# Patient Record
Sex: Male | Born: 1960 | Race: White | Hispanic: No | Marital: Married | Smoking: Never smoker
Health system: Southern US, Community
[De-identification: ages and names within clinical notes are randomized; demographics above are authoritative.]

## PROBLEM LIST (undated history)

## (undated) DIAGNOSIS — I10 Essential (primary) hypertension: Secondary | ICD-10-CM

## (undated) HISTORY — PX: HERNIA REPAIR: SHX51

## (undated) HISTORY — PX: OTHER SURGICAL HISTORY: SHX169

---

## 2007-03-07 ENCOUNTER — Ambulatory Visit: Payer: Self-pay | Admitting: Internal Medicine

## 2008-02-08 ENCOUNTER — Ambulatory Visit: Payer: Self-pay | Admitting: Family Medicine

## 2010-11-10 ENCOUNTER — Ambulatory Visit: Payer: Self-pay | Admitting: Family Medicine

## 2010-12-21 ENCOUNTER — Ambulatory Visit: Payer: Self-pay | Admitting: Internal Medicine

## 2011-08-26 ENCOUNTER — Ambulatory Visit: Payer: Self-pay | Admitting: Emergency Medicine

## 2012-05-03 ENCOUNTER — Ambulatory Visit: Payer: Self-pay

## 2012-05-11 ENCOUNTER — Ambulatory Visit: Payer: Self-pay | Admitting: Family Medicine

## 2012-11-14 ENCOUNTER — Ambulatory Visit: Payer: Self-pay | Admitting: Family Medicine

## 2013-06-04 IMAGING — CR DG CHEST 2V
1 series · 3 of 3 positions shown · non-contrast
Comparison: none

REASON FOR EXAM: cough for over a week
COMMENTS:

[Series 1: pa · 0.17mm/px · 3 of 3 slices shown]
[im 1/3]
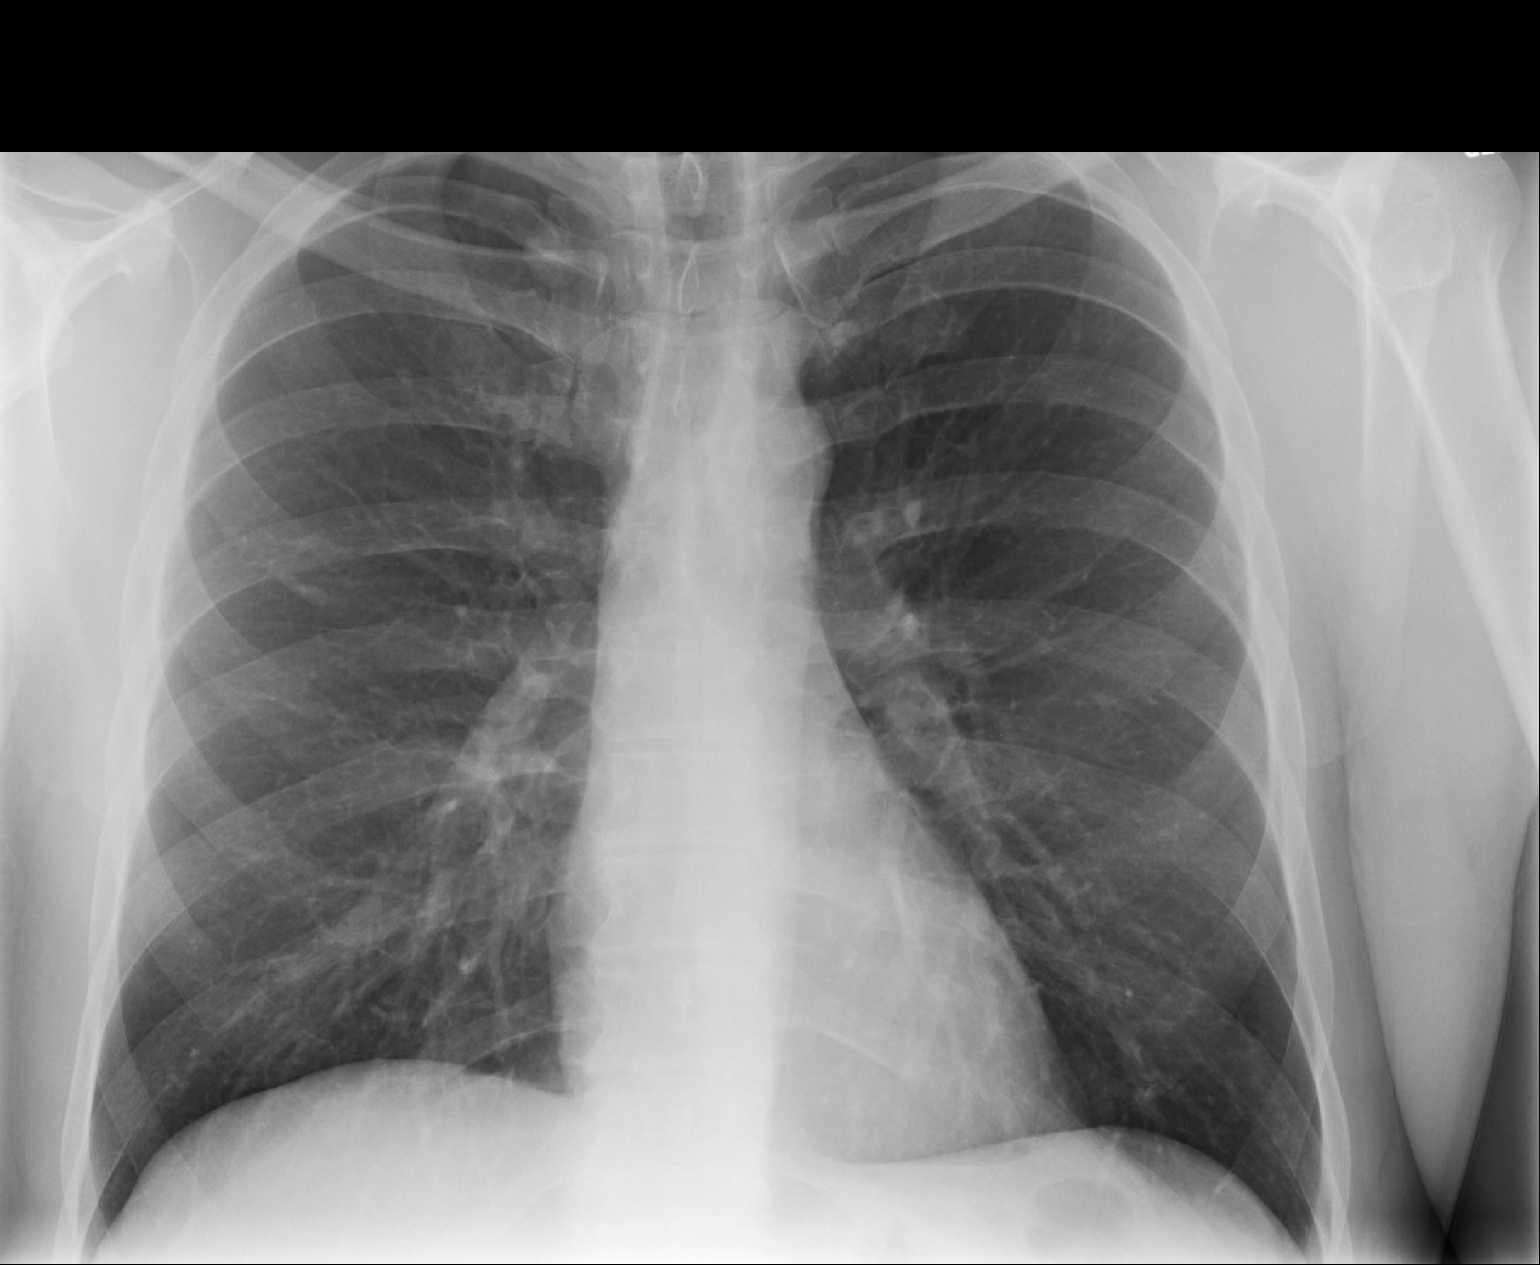
[im 2/3]
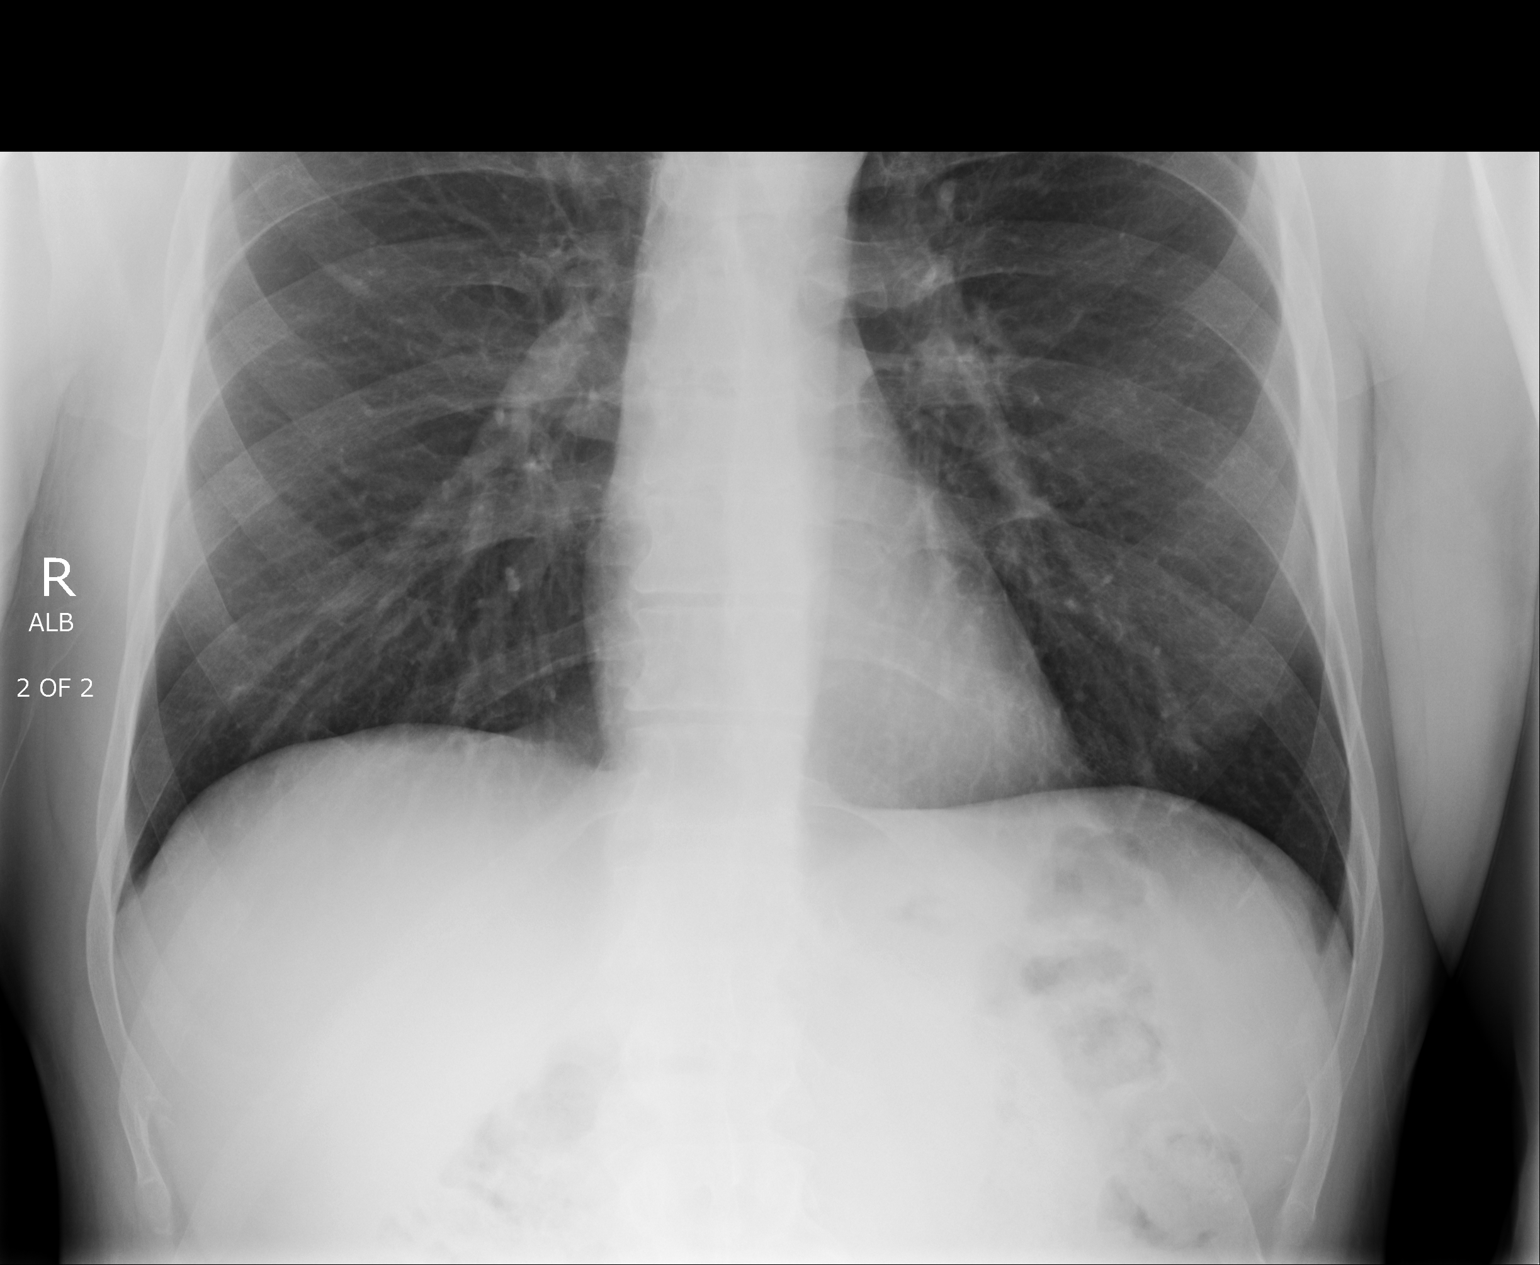
[im 3/3]
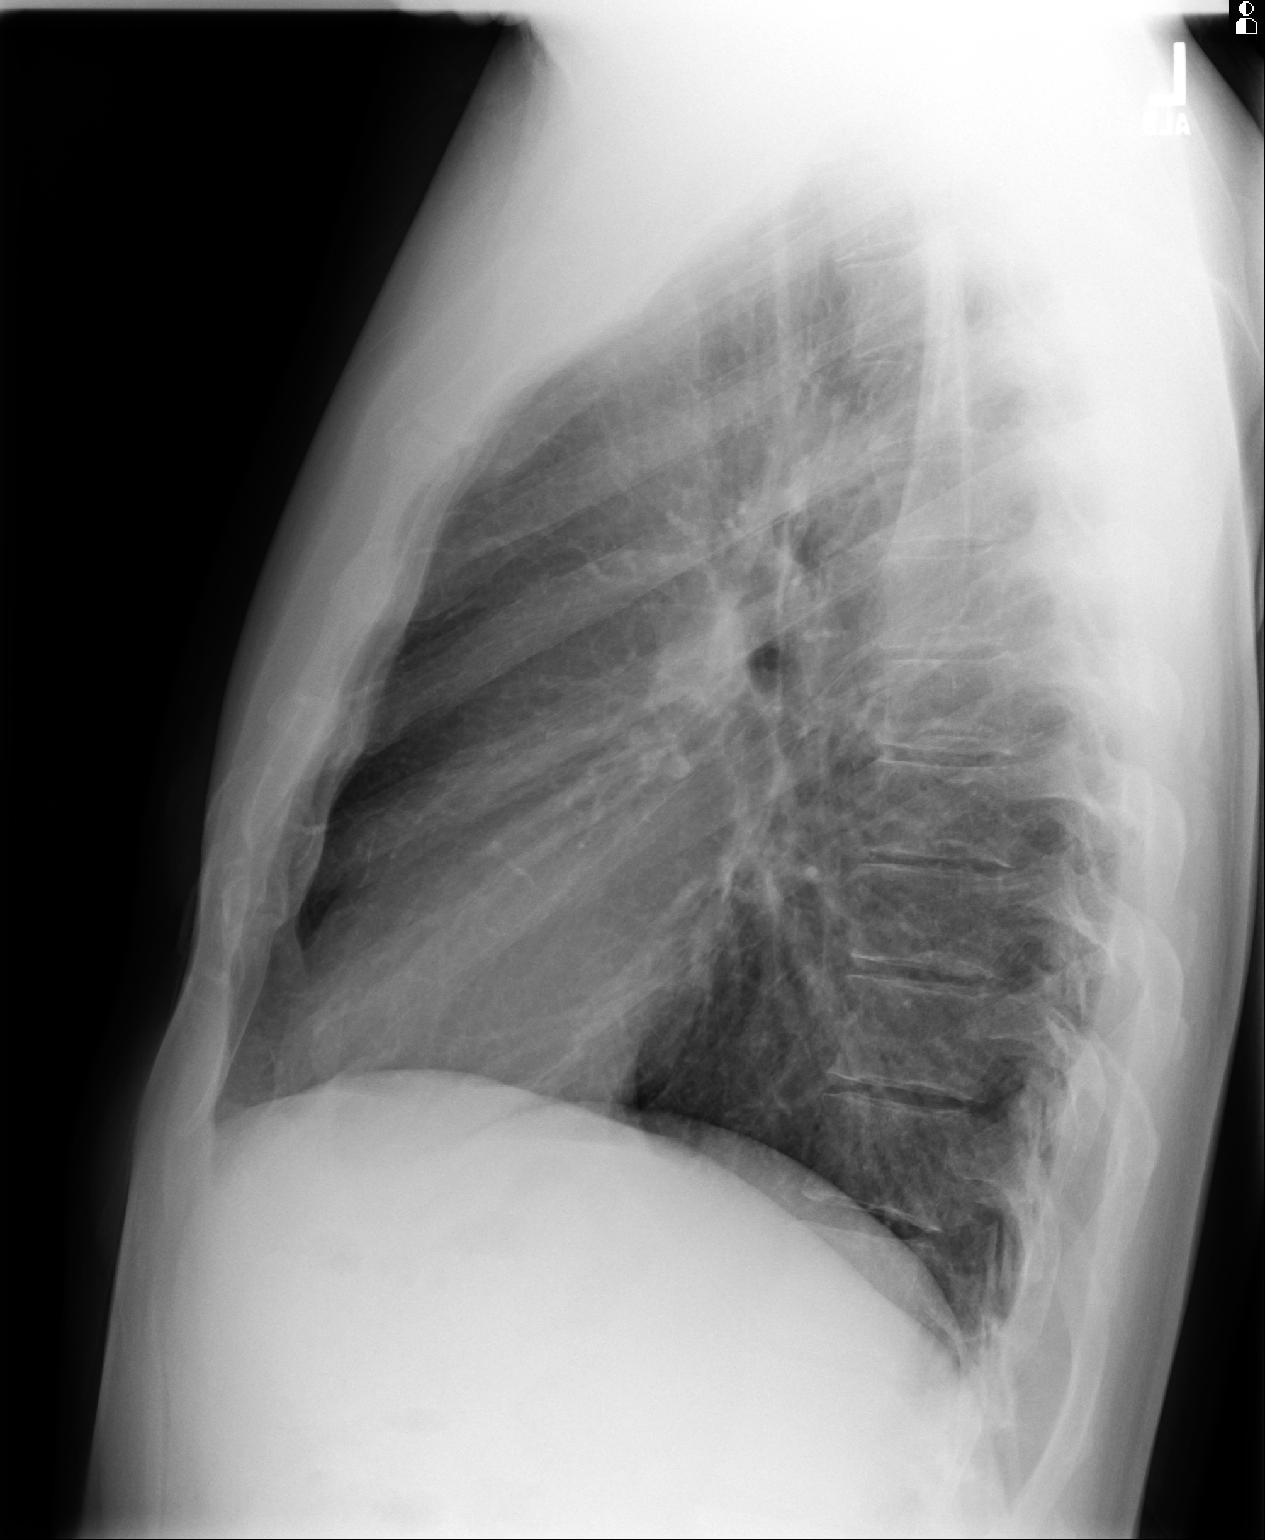

[3 of 3 positions shown; findings below may reference images not displayed]

PROCEDURE:     MDR - MDR CHEST PA(OR AP) AND LATERAL  - May 03, 2012  [DATE]

RESULT:     The lungs are well-expanded. There is no focal infiltrate. The
cardiac silhouette is normal in size. The mediastinum is normal in width.
There is no pleural effusion or pneumothorax or pneumomediastinum. The bony
thorax exhibits no acute abnormality.
IMPRESSION: There is no evidence of pneumonia nor CHF. I cannot exclude
acute bronchitis in the appropriate clinical setting.

[REDACTED]

## 2014-04-11 ENCOUNTER — Ambulatory Visit: Payer: Self-pay | Admitting: Family Medicine

## 2014-04-11 LAB — RAPID STREP-A WITH REFLX: Micro Text Report: NEGATIVE

## 2014-04-15 LAB — BETA STREP CULTURE(ARMC)

## 2015-03-25 ENCOUNTER — Ambulatory Visit
Admission: EM | Admit: 2015-03-25 | Discharge: 2015-03-25 | Disposition: A | Discharge status: Home / Self Care | Payer: BLUE CROSS/BLUE SHIELD | Attending: Family Medicine | Admitting: Family Medicine

## 2015-03-25 DIAGNOSIS — R05 Cough: Secondary | ICD-10-CM | POA: Diagnosis not present

## 2015-03-25 DIAGNOSIS — J0100 Acute maxillary sinusitis, unspecified: Secondary | ICD-10-CM | POA: Diagnosis not present

## 2015-03-25 DIAGNOSIS — R059 Cough, unspecified: Secondary | ICD-10-CM

## 2015-03-25 HISTORY — DX: Essential (primary) hypertension: I10

## 2015-03-25 MED ORDER — HYDROCOD POLST-CPM POLST ER 10-8 MG/5ML PO SUER: 5.0000 mL | Freq: Two times a day (BID) | ORAL | Status: DC

## 2015-03-25 MED ORDER — AMOXICILLIN 875 MG PO TABS: 875.0000 mg | ORAL_TABLET | Freq: Two times a day (BID) | ORAL | Status: DC

## 2015-03-25 NOTE — ED Notes (Signed)
Started 2 weeks ago with non productive. 4-5 days ago with sinus drainage/pressure and left ear pain

## 2015-03-25 NOTE — ED Provider Notes (Signed)
CSN: [id number]     Arrival date & time 03/25/15  [id number] History   First MD Initiated Contact with Patient 03/25/15 [phone]     Chief Complaint  Patient presents with  . URI   (Consider location/radiation/quality/duration/timing/severity/associated sxs/prior Treatment) Patient is a 54 y.o. male presenting with URI.  URI Presenting symptoms: congestion, cough, ear pain, facial pain and rhinorrhea   Presenting symptoms: no fatigue, no fever and no sore throat   Severity:  Moderate Onset quality:  Sudden Duration:  2 weeks Timing:  Constant Progression:  Worsening Chronicity:  New Ineffective treatments:  OTC medications Associated symptoms: headaches and sinus pain   Associated symptoms: no arthralgias, no myalgias, no neck pain, no swollen glands and no wheezing     Past Medical History  Diagnosis Date  . Hypertension    Past Surgical History  Procedure Laterality Date  . Hernia repair     History reviewed. No pertinent family history. Social History  Substance Use Topics  . Smoking status: Never Smoker   . Smokeless tobacco: None  . Alcohol Use: No    Review of Systems  Constitutional: Negative for fever and fatigue.  HENT: Positive for congestion, ear pain and rhinorrhea. Negative for sore throat.   Respiratory: Positive for cough. Negative for wheezing.   Musculoskeletal: Negative for myalgias, arthralgias and neck pain.  Neurological: Positive for headaches.    Allergies  Sulfa antibiotics  Home Medications   Prior to Admission medications   Medication Sig Start Date End Date Taking? Authorizing Provider  losartan (COZAAR) 25 MG tablet Take 25 mg by mouth daily.   Yes Historical Provider, MD  zolpidem (AMBIEN) 5 MG tablet Take 5 mg by mouth at bedtime as needed for sleep.   Yes Historical Provider, MD  amoxicillin (AMOXIL) 875 MG tablet Take 1 tablet (875 mg total) by mouth 2 (two) times daily. 03/25/15   Payton Mccallumrlando Avenell Sellers, MD  chlorpheniramine-HYDROcodone  (TUSSIONEX PENNKINETIC ER) 10-8 MG/5ML SUER Take 5 mLs by mouth 2 (two) times daily. prn 03/25/15   Payton Mccallumrlando Christian Borgerding, MD   Meds Ordered and Administered this Visit  Medications - No data to display  BP 160/89 mmHg  Pulse 62  Temp(Src) 97.3 F (36.3 C) (Tympanic)  Resp 16  Ht 6\' 4"  (1.93 m)  Wt 200 lb (90.719 kg)  BMI 24.35 kg/m2  SpO2 100% No data found.   Physical Exam  Constitutional: He appears well-developed and well-nourished. No distress.  HENT:  Head: Normocephalic and atraumatic.  Right Ear: Tympanic membrane, external ear and ear canal normal.  Left Ear: External ear and ear canal normal. A middle ear effusion is present.  Nose: Right sinus exhibits maxillary sinus tenderness and frontal sinus tenderness. Left sinus exhibits maxillary sinus tenderness and frontal sinus tenderness.  Mouth/Throat: Uvula is midline, oropharynx is clear and moist and mucous membranes are normal. No oropharyngeal exudate or tonsillar abscesses.  Eyes: Conjunctivae and EOM are normal. Pupils are equal, round, and reactive to light. Right eye exhibits no discharge. Left eye exhibits no discharge. No scleral icterus.  Neck: Normal range of motion. Neck supple. No tracheal deviation present. No thyromegaly present.  Cardiovascular: Normal rate, regular rhythm and normal heart sounds.   Pulmonary/Chest: Effort normal and breath sounds normal. No stridor. No respiratory distress. He has no wheezes. He has no rales. He exhibits no tenderness.  Lymphadenopathy:    He has no cervical adenopathy.  Neurological: He is alert.  Skin: Skin is warm and dry. No rash noted.  He is not diaphoretic.  Nursing note and vitals reviewed.   ED Course  Procedures (including critical care time)  Labs Review Labs Reviewed - No data to display  Imaging Review No results found.   Visual Acuity Review  Right Eye Distance:   Left Eye Distance:   Bilateral Distance:    Right Eye Near:   Left Eye Near:     Bilateral Near:         MDM   1. Acute maxillary sinusitis, recurrence not specified   2. Cough    Discharge Medication List as of 03/25/2015 10:06 AM    START taking these medications   Details  amoxicillin (AMOXIL) 875 MG tablet Take 1 tablet (875 mg total) by mouth 2 (two) times daily., Starting 03/25/2015, Until Discontinued, Normal    chlorpheniramine-HYDROcodone (TUSSIONEX PENNKINETIC ER) 10-8 MG/5ML SUER Take 5 mLs by mouth 2 (two) times daily. prn, Starting 03/25/2015, Until Discontinued, Normal      1. diagnosis reviewed with patient 2. rx as per orders above; reviewed possible side effects, interactions, risks and benefits  3. Recommend supportive treatment with increased fluids, otc analgesics 4. Follow-up prn if symptoms worsen or don't improve    Payton Mccallum, MD 03/25/15 1012

## 2015-07-08 ENCOUNTER — Ambulatory Visit
Admission: EM | Admit: 2015-07-08 | Discharge: 2015-07-08 | Disposition: A | Discharge status: Home / Self Care | Payer: BLUE CROSS/BLUE SHIELD | Attending: Family Medicine | Admitting: Family Medicine

## 2015-07-08 DIAGNOSIS — H1089 Other conjunctivitis: Secondary | ICD-10-CM | POA: Diagnosis not present

## 2015-07-08 DIAGNOSIS — A499 Bacterial infection, unspecified: Secondary | ICD-10-CM | POA: Diagnosis not present

## 2015-07-08 DIAGNOSIS — H109 Unspecified conjunctivitis: Secondary | ICD-10-CM

## 2015-07-08 MED ORDER — TETRACAINE HCL 0.5 % OP SOLN: 2.0000 [drp] | Freq: Once | OPHTHALMIC | Status: DC

## 2015-07-08 MED ORDER — FLUORESCEIN SODIUM 1 MG OP STRP
1.0000 | ORAL_STRIP | Freq: Once | OPHTHALMIC | Status: DC
Start: 2015-07-08 — End: 2015-07-08

## 2015-07-08 MED ORDER — ERYTHROMYCIN 5 MG/GM OP OINT: 1.0000 "application " | TOPICAL_OINTMENT | Freq: Four times a day (QID) | OPHTHALMIC | Status: DC

## 2015-07-08 NOTE — ED Notes (Addendum)
Patient c/o yellow discharge in right eye, itchy and red, and blurred vision which he noticed this morning at 9:00am.

## 2015-07-08 NOTE — Discharge Instructions (Signed)
Use medication as prescribed. Practice good hand hygiene.   Follow up with your primary care physician or ophthalmology this week as needed.    Return to Urgent care or ER for vision changes, eye pain, swelling, new or worsening concerns.   Bacterial Conjunctivitis Bacterial conjunctivitis (commonly called pink eye) is redness, soreness, or puffiness (inflammation) of the white part of your eye. It is caused by a germ called bacteria. These germs can easily spread from person to person (contagious). Your eye often will become red or pink. Your eye may also become irritated, watery, or have a thick discharge.  HOME CARE   Apply a cool, clean washcloth over closed eyelids. Do this for 10-20 minutes, 3-4 times a day while you have pain.  Gently wipe away any fluid coming from the eye with a warm, wet washcloth or cotton ball.  Wash your hands often with soap and water. Use paper towels to dry your hands.  Do not share towels or washcloths.  Change or wash your pillowcase every day.  Do not use eye makeup until the infection is gone.  Do not use machines or drive if your vision is blurry.  Stop using contact lenses. Do not use them again until your doctor says it is okay.  Do not touch the tip of the eye drop bottle or medicine tube with your fingers when you put medicine on the eye. GET HELP RIGHT AWAY IF:   Your eye is not better after 3 days of starting your medicine.  You have a yellowish fluid coming out of the eye.  You have more pain in the eye.  Your eye redness is spreading.  Your vision becomes blurry.  You have a fever or lasting symptoms for more than 2-3 days.  You have a fever and your symptoms suddenly get worse.  You have pain in the face.  Your face gets red or puffy (swollen). MAKE SURE YOU:   Understand these instructions.  Will watch this condition.  Will get help right away if you are not doing well or get worse.   This information is not  intended to replace advice given to you by your health care provider. Make sure you discuss any questions you have with your health care provider.   Document Released: 01/19/2008 Document Revised: 03/28/2012 Document Reviewed: 12/16/2011 Elsevier Interactive Patient Education [employer]

## 2015-07-08 NOTE — ED Provider Notes (Signed)
[facility]  ____________________________________________  Time seen: Approximately 5:28 PM  I have reviewed the triage vital signs and the nursing notes.   HISTORY  Chief Complaint Conjunctivitis  HPI Andrew Atkinson is a 55 y.o. male presents for the complaint of redness and drainage from right eye 1 day. Patient reports felt fine yesterday. Patient states that when he woke up this morning his right eye felt somewhat irritated and itchy. Patient states that as the day progressed his eye became more irritated, itchy as well as drainage from the right eye. Patient states that he is having a lo of greenish yellowish drainage from right eye. Denies known sick contacts. Denies foreign bodies, chemical exposure, eye pain, vision changes or trauma to his eyes. Denies any pain, redness or swelling around his eyes. Denies headache. Denies vision changes. Reports does wear reading glasses but does not wear contacts.  Reports he has some nasal congestion that is consistent with his seasonal allergies. Denies recent changes in congestion. Denies cough or sore throat. Denies fevers. Denies neck or back pain. Denies chest pain or shortness of breath or dizziness or hearing changes.   Past Medical History  Diagnosis Date  . Hypertension     There are no active problems to display for this patient.   Past Surgical History  Procedure Laterality Date  . Hernia repair    . Right knee arthoscopy      Current Outpatient Rx  Name  Route  Sig  Dispense  Refill                        . losartan (COZAAR) 25 MG tablet   Oral   Take 25 mg by mouth daily.         Marland Kitchen zolpidem (AMBIEN) 5 MG tablet   Oral   Take 5 mg by mouth at bedtime as needed for sleep.           Allergies Sulfa antibiotics  History reviewed. No pertinent family history.  Social History Social History  Substance Use Topics  . Smoking status: Never Smoker   . Smokeless tobacco: None  . Alcohol Use: No     Review of Systems Constitutional: No fever/chills Eyes: No visual changes.Right eye irritation and drainage. ENT: No sore throat. Cardiovascular: Denies chest pain. Respiratory: Denies shortness of breath. Gastrointestinal: No abdominal pain.  No nausea, no vomiting.  No diarrhea.  No constipation. Genitourinary: Negative for dysuria. Musculoskeletal: Negative for back pain. Skin: Negative for rash. Neurological: Negative for headaches, focal weakness or numbness.  10-point ROS otherwise negative.  ____________________________________________   PHYSICAL EXAM:  VITAL SIGNS: ED Triage Vitals  Enc Vitals Group     BP 07/08/15 1704 142/86 mmHg     Pulse Rate 07/08/15 1704 64     Resp 07/08/15 1704 16     Temp 07/08/15 1704 98.6 F (37 C)     Temp Source 07/08/15 1704 Oral     SpO2 07/08/15 1704 98 %     Weight 07/08/15 1704 200 lb (90.719 kg)     Height 07/08/15 1704  (1.93 m)     Head Cir --      Peak Flow --      Pain Score 07/08/15 1709 2     Pain Loc --      Pain Edu? --      Excl. in GC? --     Visual Acuity MA  Visual Acuity - Bilateral  Distance: 20/20 (Uncorrected) ; R Distance: 20/40 (Uncorrected) ; L Distance: 20/25 (Uncorrected)     Constitutional: Alert and oriented. Well appearing and in no acute distress. Eyes:Visual acuity--see nursing documentation; No globe trauma noted; Eyelids normal to inspection; No surrounding erythema or edema; Nontender; Conjunctiva and sclera: Right: Mild injection, left: Normal appearance;  Right Examined with fluorescein, EOM's intact; Pupils PERRL; Anterior Chambers normal with limited exam.  Head: Atraumatic. No sinus tenderness to palpation. No swelling. No erythema.  Ears: no erythema, normal TMs bilaterally.   Nose:Nasal congestion with clear rhinorrhea  Mouth/Throat: Mucous membranes are moist. No pharyngeal erythema. No tonsillar swelling or exudate.  Neck: No stridor.  No cervical spine tenderness to  palpation. Hematological/Lymphatic/Immunilogical: No cervical lymphadenopathy. Cardiovascular: Normal rate, regular rhythm. Grossly normal heart sounds.  Good peripheral circulation. Respiratory: Normal respiratory effort.  No retractions. Lungs CTAB.No wheezes, rales or rhonchi. Good air movement.  Gastrointestinal: Soft and nontender.  Musculoskeletal: No lower or upper extremity tenderness nor edema. No cervical, thoracic or lumbar tenderness to palpation. Neurologic:  Normal speech and language. No gross focal neurologic deficits are appreciated. No gait instability. Skin:  Skin is warm, dry and intact. No rash noted. Psychiatric: Mood and affect are normal. Speech and behavior are normal.  ____________________________________________   LABS (all labs ordered are listed, but only abnormal results are displayed)  Labs Reviewed - No data to display  PROCEDURES  Procedure(s) performed:  Eye exam Procedure explained and verbal consent obtained.  Anesthesia: tetracaine ophthalmic 2 drops Right eye examined with fluorescein strip.  No foreign bodies visualized. No corneal abrasion. Patient tolerated well.  _______________________   INITIAL IMPRESSION / ASSESSMENT AND PLAN / ED COURSE  Pertinent labs & imaging results that were available during my care of the patient were reviewed by me and considered in my medical decision making (see chart for details).  Very well-appearing patient. No acute distress. Presents for complaints of 1 day of right eye itching irritation and drainage. Denies foreign bodies, chemicals, trauma. Denies vision changes. Right eye mildly injected with copious greenish yellowish active drainage. No foreign bodies or corneal abrasion visualized. Will treat bacterial conjunctivitis with erythromycin ophthalmic ointment. Discussed hand hygiene. Discussed indication, risks and benefits of medications with patient. Encourage PCP or ophthalmology  follow-up.  Discussed follow up with Primary care physician this week. Discussed follow up and return parameters including no resolution or any worsening concerns. Patient verbalized understanding and agreed to plan.   ____________________________________________   FINAL CLINICAL IMPRESSION(S) / ED DIAGNOSES  Final diagnoses:  Bacterial conjunctivitis of right eye      Note: This dictation was prepared with Dragon dictation along with smaller phrase technology. Any transcriptional errors that result from this process are unintentional.     Renford DillsLindsey Murielle Stang, NP 07/08/15 1953

## 2021-10-29 ENCOUNTER — Ambulatory Visit
Admission: EM | Admit: 2021-10-29 | Discharge: 2021-10-29 | Disposition: A | Discharge status: Home / Self Care | Payer: BC Managed Care – PPO

## 2021-10-29 ENCOUNTER — Encounter: Payer: Self-pay | Admitting: Emergency Medicine

## 2021-10-29 DIAGNOSIS — R079 Chest pain, unspecified: Secondary | ICD-10-CM

## 2021-10-29 NOTE — ED Triage Notes (Signed)
Patient c/o chest pain that started yesterday and got worse last night.  Patient he still does not feel better.  Patient reports ongoing mid chest pain.  Patient reports some SOB.  Patient reports some nausea.

## 2021-10-29 NOTE — ED Provider Notes (Signed)
[facility]    CSN: [id number] Arrival date & time: 10/29/21  1633      History   Chief Complaint Chief Complaint  Patient presents with   Chest Pain    HPI Andrew Atkinson is a 61 y.o. male.   Patient presents with constant centralized chest pain, nausea without vomiting, mild shortness of breath and a mild generalized headache beginning 1 day ago.  Initially had associated tachycardia which has resolved.  Pain is rated a 7 out of 10 in, described as a heaviness and discomfort fluctuating in intensity.  Has attempted use of Tagamet which has been ineffective.  History of hypertension, taking losartan and amlodipine daily.  Endorses that his baseline heart rate fluctuates from the high 50s to mid 60s.     Past Medical History:  Diagnosis Date   Hypertension     There are no problems to display for this patient.   Past Surgical History:  Procedure Laterality Date   HERNIA REPAIR     Right Knee Arthoscopy         Home Medications    Prior to Admission medications   Medication Sig Start Date End Date Taking? Authorizing Provider  amLODipine (NORVASC) 2.5 MG tablet Take 2.5 mg by mouth daily. 09/30/21  Yes [provider]  FLUoxetine (PROZAC) 20 MG capsule Take 20 mg by mouth daily. 08/03/21  Yes [provider]  losartan (COZAAR) 25 MG tablet Take 25 mg by mouth daily.   Yes [provider]  traZODone (DESYREL) 50 MG tablet Take 2 tablets by mouth at bedtime. 06/19/18  Yes [provider]  amoxicillin (AMOXIL) 875 MG tablet Take 1 tablet (875 mg total) by mouth 2 (two) times daily. 03/25/15   Payton Mccallum, MD  chlorpheniramine-HYDROcodone (TUSSIONEX PENNKINETIC ER) 10-8 MG/5ML SUER Take 5 mLs by mouth 2 (two) times daily. prn 03/25/15   Payton Mccallum, MD  erythromycin ophthalmic ointment Place 1 application into the right eye 4 (four) times daily. For seven days 07/08/15   Renford Dills, NP  zolpidem (AMBIEN) 5 MG tablet Take  5 mg by mouth at bedtime as needed for sleep.    [provider]    Family History History reviewed. No pertinent family history.  Social History Social History   Tobacco Use   Smoking status: Never   Smokeless tobacco: Never  Vaping Use   Vaping Use: Never used  Substance Use Topics   Alcohol use: No     Allergies   Sulfa antibiotics   Review of Systems Review of Systems  Constitutional: Negative.   HENT: Negative.    Eyes: Negative.   Respiratory: Negative.    Cardiovascular:  Positive for chest pain. Negative for palpitations and leg swelling.  Skin: Negative.   Neurological:  Positive for headaches. Negative for dizziness, tremors, seizures, syncope, facial asymmetry, speech difficulty, weakness, light-headedness and numbness.     Physical Exam Triage Vital Signs ED Triage Vitals  Enc Vitals Group     BP 10/29/21 1643 (!) 137/99     Pulse Rate 10/29/21 1643 63     Resp 10/29/21 1643 15     Temp 10/29/21 1643 98.7 F (37.1 C)     Temp Source 10/29/21 1643 Oral     SpO2 10/29/21 1643 98 %     Weight 10/29/21 1641 210 lb (95.3 kg)     Height 10/29/21 1641 6\' 4"  (1.93 m)     Head Circumference --  Peak Flow --      Pain Score 10/29/21 1640 7     Pain Loc --      Pain Edu? --      Excl. in GC? --    No data found.  Updated Vital Signs BP (!) 137/99 (BP Location: Left Arm)   Pulse 63   Temp 98.7 F (37.1 C) (Oral)   Resp 15   Ht 6\' 4"  (1.93 m)   Wt 210 lb (95.3 kg)   SpO2 98%   BMI 25.56 kg/m   Visual Acuity Right Eye Distance:   Left Eye Distance:   Bilateral Distance:    Right Eye Near:   Left Eye Near:    Bilateral Near:     Physical Exam   UC Treatments / Results  Labs (all labs ordered are listed, but only abnormal results are displayed) Labs Reviewed - No data to display  EKG   Radiology No results found.  Procedures Procedures (including critical care time)  Medications Ordered in UC Medications - No  data to display  Initial Impression / Assessment and Plan / UC Course  I have reviewed the triage vital signs and the nursing notes.  Pertinent labs & imaging results that were available during my care of the patient were reviewed by me and considered in my medical decision making (see chart for details).  Chest pain  Vital signs are stable, EKG showing sinus bradycardia with a heart rate of 57, patient is in no signs of distress but chest pain is active and with associated symptoms patient is being sent to the nearest emergency department for further evaluation and management, able to escort self and plans to go to [facility]  Final Clinical Impressions(s) / UC Diagnoses   Final diagnoses:  None   Discharge Instructions   None    ED Prescriptions   None    PDMP not reviewed this encounter.   [facility], Valinda Hoar 10/29/21 1657

## 2021-10-29 NOTE — ED Notes (Signed)
Patient is being discharged from the Urgent Care and sent to the [facility] Emergency Department via private vehicle . Per Salli Quarry, NP, patient is in need of higher level of care due to chest pain. Patient is aware and verbalizes understanding of plan of care.  Vitals:   10/29/21 1643  BP: (!) 137/99  Pulse: 63  Resp: 15  Temp: 98.7 F (37.1 C)  SpO2: 98%

## 2021-10-29 NOTE — Discharge Instructions (Signed)
Please go to the nearest emergency department for further evaluation and management of your chest pain, on your EKG it showed that your heart was beating in a normal rhythm and the pace was 57 however due to limitations of the urgent care we had an unable to complete work-up that would fully rule out that there is no  involvement of your heart that is causing your discomfort
